# Patient Record
Sex: Female | Born: 1975 | Race: Black or African American | Hispanic: No | Marital: Single | Smoking: Never smoker
Health system: Southern US, Community
[De-identification: ages and names within clinical notes are randomized; demographics above are authoritative.]

## PROBLEM LIST (undated history)

## (undated) DIAGNOSIS — I10 Essential (primary) hypertension: Secondary | ICD-10-CM

---

## 2016-05-06 ENCOUNTER — Encounter (HOSPITAL_BASED_OUTPATIENT_CLINIC_OR_DEPARTMENT_OTHER): Payer: Self-pay | Admitting: *Deleted

## 2016-05-06 ENCOUNTER — Emergency Department (HOSPITAL_BASED_OUTPATIENT_CLINIC_OR_DEPARTMENT_OTHER): Payer: BC Managed Care – PPO

## 2016-05-06 ENCOUNTER — Emergency Department (HOSPITAL_BASED_OUTPATIENT_CLINIC_OR_DEPARTMENT_OTHER)
Admission: EM | Admit: 2016-05-06 | Discharge: 2016-05-06 | Disposition: A | Discharge status: Home / Self Care | Payer: BC Managed Care – PPO | Attending: Emergency Medicine | Admitting: Emergency Medicine

## 2016-05-06 DIAGNOSIS — O209 Hemorrhage in early pregnancy, unspecified: Secondary | ICD-10-CM | POA: Diagnosis not present

## 2016-05-06 DIAGNOSIS — O200 Threatened abortion: Secondary | ICD-10-CM | POA: Diagnosis not present

## 2016-05-06 DIAGNOSIS — N939 Abnormal uterine and vaginal bleeding, unspecified: Secondary | ICD-10-CM

## 2016-05-06 DIAGNOSIS — N898 Other specified noninflammatory disorders of vagina: Secondary | ICD-10-CM | POA: Diagnosis not present

## 2016-05-06 DIAGNOSIS — Z3A08 8 weeks gestation of pregnancy: Secondary | ICD-10-CM | POA: Insufficient documentation

## 2016-05-06 DIAGNOSIS — I10 Essential (primary) hypertension: Secondary | ICD-10-CM | POA: Diagnosis not present

## 2016-05-06 HISTORY — DX: Essential (primary) hypertension: I10

## 2016-05-06 LAB — WET PREP, GENITAL
SPERM: NONE SEEN
Trich, Wet Prep: NONE SEEN
Yeast Wet Prep HPF POC: NONE SEEN

## 2016-05-06 LAB — URINALYSIS, ROUTINE W REFLEX MICROSCOPIC
Bilirubin Urine: NEGATIVE
Glucose, UA: NEGATIVE mg/dL
HGB URINE DIPSTICK: NEGATIVE
Ketones, ur: NEGATIVE mg/dL
Leukocytes, UA: NEGATIVE
NITRITE: NEGATIVE
PH: 7 (ref 5.0–8.0)
Protein, ur: NEGATIVE mg/dL
SPECIFIC GRAVITY, URINE: 1.024 (ref 1.005–1.030)

## 2016-05-06 LAB — CBC WITH DIFFERENTIAL/PLATELET
Basophils Absolute: 0 10*3/uL (ref 0.0–0.1)
Basophils Relative: 0 %
Eosinophils Absolute: 0.1 10*3/uL (ref 0.0–0.7)
Eosinophils Relative: 1 %
HCT: 34.7 % — ABNORMAL LOW (ref 36.0–46.0)
HEMOGLOBIN: 11.9 g/dL — AB (ref 12.0–15.0)
LYMPHS ABS: 1.5 10*3/uL (ref 0.7–4.0)
LYMPHS PCT: 31 %
MCH: 31.2 pg (ref 26.0–34.0)
MCHC: 34.3 g/dL (ref 30.0–36.0)
MCV: 90.8 fL (ref 78.0–100.0)
Monocytes Absolute: 0.6 10*3/uL (ref 0.1–1.0)
Monocytes Relative: 13 %
NEUTROS PCT: 55 %
Neutro Abs: 2.6 10*3/uL (ref 1.7–7.7)
Platelets: 173 10*3/uL (ref 150–400)
RBC: 3.82 MIL/uL — AB (ref 3.87–5.11)
RDW: 11.5 % (ref 11.5–15.5)
WBC: 4.7 10*3/uL (ref 4.0–10.5)

## 2016-05-06 LAB — HCG, QUANTITATIVE, PREGNANCY: hCG, Beta Chain, Quant, S: 281835 m[IU]/mL — ABNORMAL HIGH (ref <5)

## 2016-05-06 LAB — PREGNANCY, URINE: Preg Test, Ur: POSITIVE — AB

## 2016-05-06 MED ORDER — METRONIDAZOLE 500 MG PO TABS: 500.0000 mg | ORAL_TABLET | Freq: Two times a day (BID) | ORAL | 0 refills | Status: AC

## 2016-05-06 NOTE — ED Notes (Signed)
Pt reports lower abdominal cramping that started on Friday. Pt had (+) preg test on 04/17/16. Pts first day of LMP was 03/17/16. Pt was due to have first OB appt in February.

## 2016-05-06 NOTE — ED Notes (Signed)
Pt is [id number]

## 2016-05-06 NOTE — ED Notes (Signed)
Pt transported to US at this time. 

## 2016-05-06 NOTE — ED Triage Notes (Addendum)
Pt c/o lower abd cramping x 3 days and vaginal bleeding with clots x 1 day HOme preg positive ? 6 weeks preg

## 2016-05-06 NOTE — Discharge Instructions (Signed)
Your ultrasound is concerning for an abnormal pregnancy.  Please follow up with your OBGYN for further evaluation.    CLINICAL DATA:  Vaginal bleeding and cramping for 4 days. Gestational age by LMP of 7 weeks 1 day.   EXAM: OBSTETRIC <14 WK US AND TRANSVAGINAL OB US   TECHNIQUE: Both transabdominal and transvaginal ultrasound examinations were performed for complete evaluation of the gestation as well as the maternal uterus, adnexal regions, and pelvic cul-de-sac. Transvaginal technique was performed to assess early pregnancy.   COMPARISON:  None.   FINDINGS: Intrauterine gestational sac: Single   Yolk sac:  Visualized.   Embryo: Visualized, but unusual appearance with suspected diffuse body wall edema/possible early hydrops   Cardiac Activity: Visualized, but cardiac activity appears abnormal location, course the periphery of the trunk   Heart Rate: 145  bpm   CRL:  22  mm   8 w   6 d                  US EDC: 12/10/2016   Subchorionic hemorrhage:  Small subchorionic hemorrhage   Maternal uterus/adnexae: Multiple small uterine fibroids, largest measuring 1.9 cm. Small right ovarian corpus luteum noted. No adnexal mass or abnormal free fluid identified.   IMPRESSION: Single living IUP measuring 8 weeks 6 days, with US EDC of 12/10/2016.   Abnormal appearance of embryo, possibly due to early hydrops. Aneuploidy cannot be excluded. Recommend follow-up with quantitative hCG levels, and follow-up ultrasound in 10-14 days.   Small subchorionic hemorrhage and multiple small uterine fibroids.   No suspicious adnexal mass or abnormal free fluid.

## 2016-05-06 NOTE — ED Provider Notes (Signed)
[facility] Provider Note   CSN: [id number] Arrival date & time: 05/06/16  0902     History   Chief Complaint Chief Complaint  Patient presents with  . Vaginal Bleeding    HPI Elizabeth Robertson is a 41 y.o. female.  The history is provided by the patient. No language interpreter was used.  Vaginal Bleeding  Primary symptoms include vaginal bleeding.   Elizabeth Robertson is a 41 y.o. female who presents to the Emergency Department complaining of vaginal bleeding. [name] is a G5 P4 that presents with vaginal bleeding. First day of her last menstrual period was 03/17/2016. She had a positive home pregnancy test was scheduled to see her OB/GYN in early February. She developed some lower abdominal cramping 2 days ago. Yesterday when she went to urinate she had pink mucous when she wiped. This morning when she woke she had wet undergarments covered in blood and she passed a large blood clots this morning. She has minimal lower abdominal discomfort. No fevers, nausea, vomiting. Her blood type is B+. She has a history of prior spontaneous abortion at 3-4 months Past Medical History:  Diagnosis Date  . Hypertension     There are no active problems to display for this patient.   Past Surgical History:  Procedure Laterality Date  . CESAREAN SECTION      OB History    Gravida Para Term Preterm AB Living   1             SAB TAB Ectopic Multiple Live Births                   Home Medications    Prior to Admission medications   Medication Sig Start Date End Date Taking? Authorizing Provider  labetalol (NORMODYNE) 100 MG tablet Take 100 mg by mouth 2 (two) times daily.   Yes Historical Provider, MD  metroNIDAZOLE (FLAGYL) 500 MG tablet Take 1 tablet (500 mg total) by mouth 2 (two) times daily. 05/06/16   Tilden Fossa, MD    Family History No family history on file.  Social History Social History  Substance Use Topics  . Smoking status: Never Smoker  . Smokeless  tobacco: Not on file  . Alcohol use No     Allergies   Sulfa antibiotics   Review of Systems Review of Systems  Genitourinary: Positive for vaginal bleeding.  All other systems reviewed and are negative.    Physical Exam Updated Vital Signs BP 126/78 (BP Location: Right Arm)   Pulse 78   Temp 98.2 F (36.8 C)   Resp 16   Ht 5\' 6"  (1.676 m)   Wt 165 lb (74.8 kg)   LMP 03/17/2016   SpO2 100%   BMI 26.63 kg/m   Physical Exam  Constitutional: She is oriented to person, place, and time. She appears well-developed and well-nourished.  HENT:  Head: Normocephalic and atraumatic.  Cardiovascular: Normal rate and regular rhythm.   No murmur heard. Pulmonary/Chest: Effort normal and breath sounds normal. No respiratory distress.  Abdominal: Soft. There is no tenderness. There is no rebound and no guarding.  Genitourinary:  Genitourinary Comments: Small to moderate amount of vaginal bleeding that is dark. No clot in the cervical os or vaginal canal. Cervical os is fingertip open. No CMT or adnexal tenderness. No repooling of blood.  Musculoskeletal: She exhibits no edema or tenderness.  Neurological: She is alert and oriented to person, place, and time.  Skin: Skin is warm and dry.  Psychiatric: She has a normal mood and affect. Her behavior is normal.  Nursing note and vitals reviewed.    ED Treatments / Results  Labs (all labs ordered are listed, but only abnormal results are displayed) Labs Reviewed  WET PREP, GENITAL - Abnormal; Notable for the following:       Result Value   Clue Cells Wet Prep HPF POC PRESENT (*)    WBC, Wet Prep HPF POC MANY (*)    All other components within normal limits  PREGNANCY, URINE - Abnormal; Notable for the following:    Preg Test, Ur POSITIVE (*)    All other components within normal limits  CBC WITH DIFFERENTIAL/PLATELET - Abnormal; Notable for the following:    RBC 3.82 (*)    Hemoglobin 11.9 (*)    HCT 34.7 (*)    All other  components within normal limits  HCG, QUANTITATIVE, PREGNANCY - Abnormal; Notable for the following:    hCG, Beta Chain, [place] LongestQuant, S [id number] (*)    All other components within normal limits  URINALYSIS, ROUTINE W REFLEX MICROSCOPIC  RPR  HIV ANTIBODY (ROUTINE TESTING)  GC[facility]AM[facility] PROBE AMP [facility]    EKG  EKG Interpretation None       Radiology [place] Ob Comp < 14 Wks  Result Date: 05/06/2016 CLINICAL DATA:  Vaginal bleeding and cramping for 4 days. Gestational age by LMP of 7 weeks 1 day. EXAM: OBSTETRIC <14 WK US AND TRANSVAGINAL OB US TECHNIQUE: Both transabdominal and transvaginal ultrasound examinations were performed for complete evaluation of the gestation as well as the maternal uterus, adnexal regions, and pelvic cul-de-sac. Transvaginal technique was performed to assess early pregnancy. COMPARISON:  None. FINDINGS: Intrauterine gestational sac: Single Yolk sac:  Visualized. Embryo: Visualized, but unusual appearance with suspected diffuse body wall edema/possible early hydrops Cardiac Activity: Visualized, but cardiac activity appears abnormal location, course the periphery of the trunk Heart Rate: 145  bpm CRL:  22  mm   8 w   6 d                  US EDC: 12/10/2016 Subchorionic hemorrhage:  Small subchorionic hemorrhage Maternal uterus/adnexae: Multiple small uterine fibroids, largest measuring 1.9 cm. Small right ovarian corpus luteum noted. No adnexal mass or abnormal free fluid identified. IMPRESSION: Single living IUP measuring 8 weeks 6 days, with US EDC of 12/10/2016. Abnormal appearance of embryo, possibly due to early hydrops. Aneuploidy cannot be excluded. Recommend follow-up with quantitative hCG levels, and follow-up ultrasound in 10-14 days. Small subchorionic hemorrhage and multiple small uterine fibroids. No suspicious adnexal mass or abnormal free fluid. Electronically Signed   By: Myles RosenthalJohn  Stahl M.D.   On: 05/06/2016 11:17   [place] Ob Transvaginal  Result  Date: 05/06/2016 CLINICAL DATA:  Vaginal bleeding and cramping for 4 days. Gestational age by LMP of 7 weeks 1 day. EXAM: OBSTETRIC <14 WK US AND TRANSVAGINAL OB US TECHNIQUE: Both transabdominal and transvaginal ultrasound examinations were performed for complete evaluation of the gestation as well as the maternal uterus, adnexal regions, and pelvic cul-de-sac. Transvaginal technique was performed to assess early pregnancy. COMPARISON:  None. FINDINGS: Intrauterine gestational sac: Single Yolk sac:  Visualized. Embryo: Visualized, but unusual appearance with suspected diffuse body wall edema/possible early hydrops Cardiac Activity: Visualized, but cardiac activity appears abnormal location, course the periphery of the trunk Heart Rate: 145  bpm CRL:  22  mm   8 w   6 d  [place] EDC: 12/10/2016 Subchorionic hemorrhage:  Small subchorionic hemorrhage Maternal uterus/adnexae: Multiple small uterine fibroids, largest measuring 1.9 cm. Small right ovarian corpus luteum noted. No adnexal mass or abnormal free fluid identified. IMPRESSION: Single living IUP measuring 8 weeks 6 days, with [place] EDC of 12/10/2016. Abnormal appearance of embryo, possibly due to early hydrops. Aneuploidy cannot be excluded. Recommend follow-up with quantitative hCG levels, and follow-up ultrasound in 10-14 days. Small subchorionic hemorrhage and multiple small uterine fibroids. No suspicious adnexal mass or abnormal free fluid. Electronically Signed   By: Myles Rosenthal M.D.   On: 05/06/2016 11:17    Procedures Procedures (including critical care time)  Medications Ordered in ED Medications - No data to display   Initial Impression / Assessment and Plan / ED Course  I have reviewed the triage vital signs and the nursing notes.  Pertinent labs & imaging results that were available during my care of the patient were reviewed by me and considered in my medical decision making (see chart for details).     Patient is a G5 P4  here for evaluation of vaginal bleeding. She has mild bleeding on examination with cervical os is fingertip open. Ultrasound demonstrates subchorionic hemorrhage as well as an abnormal appearance to the embryo. Discussed with patient findings of studies and concern for threatened miscarriage and potential abnormal pregnancy. Counseled patient on importance of close OB/GYN follow-up as well as return precautions for worsening bleeding.  Final Clinical Impressions(s) / ED Diagnoses   Final diagnoses:  Vaginal bleeding  Threatened miscarriage    New Prescriptions Discharge Medication List as of 05/06/2016 11:50 AM    START taking these medications   Details  metroNIDAZOLE (FLAGYL) 500 MG tablet Take 1 tablet (500 mg total) by mouth 2 (two) times daily., Starting Sun 05/06/2016, Print         Tilden Fossa, MD 05/07/16 1217

## 2016-05-06 NOTE — ED Notes (Signed)
ED Provider at bedside. 

## 2016-05-07 LAB — GC/CHLAMYDIA PROBE AMP (~~LOC~~) NOT AT ARMC
CHLAMYDIA, DNA PROBE: NEGATIVE
NEISSERIA GONORRHEA: NEGATIVE

## 2016-05-07 LAB — RPR: RPR: NONREACTIVE

## 2016-05-08 LAB — HIV ANTIBODY (ROUTINE TESTING W REFLEX): HIV SCREEN 4TH GENERATION: NONREACTIVE

## 2018-12-24 IMAGING — US US OB COMP LESS 14 WK
1 series · 13 of 28 positions shown · non-contrast
Comparison: None.

CLINICAL DATA: Vaginal bleeding and cramping for 4 days.
Gestational age by LMP of 7 weeks 1 day.

EXAM:
OBSTETRIC <14 WK US AND TRANSVAGINAL OB US
TECHNIQUE: Both transabdominal and transvaginal ultrasound examinations were
performed for complete evaluation of the gestation as well as the
maternal uterus, adnexal regions, and pelvic cul-de-sac.
Transvaginal technique was performed to assess early pregnancy.

[Series 1: us ob comp less 14 wk · 0.22mm/px · 13 of 110 slices shown]
[im 5/110]
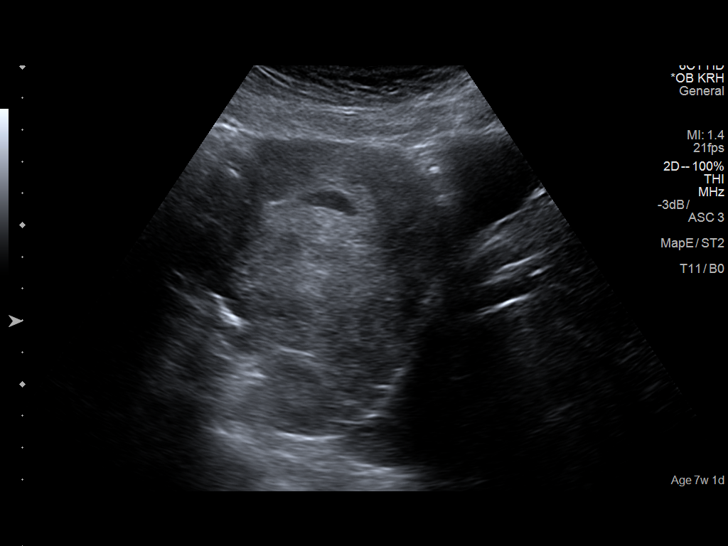
[im 13/110]
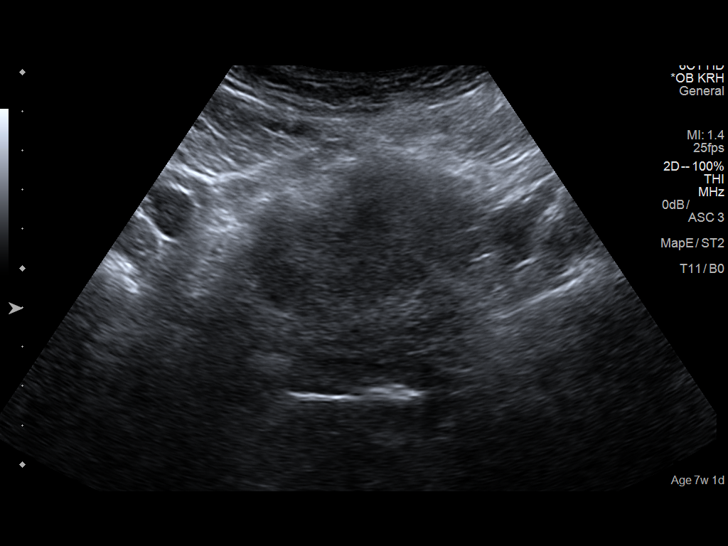
[im 21/110]
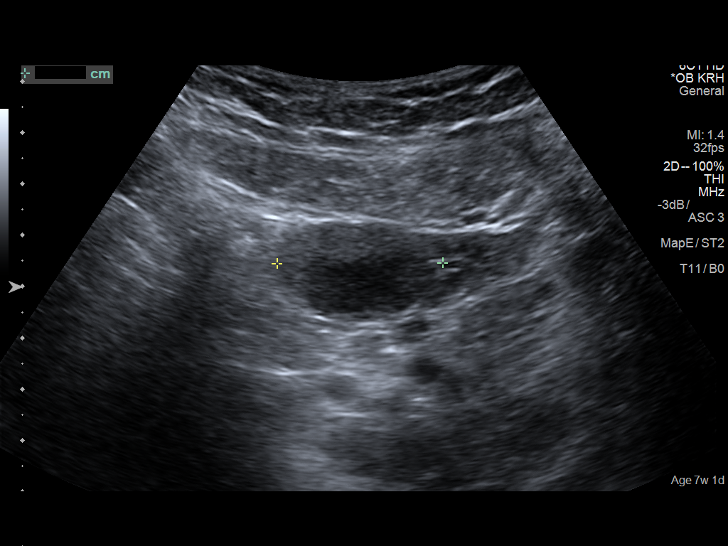
[im 29/110]
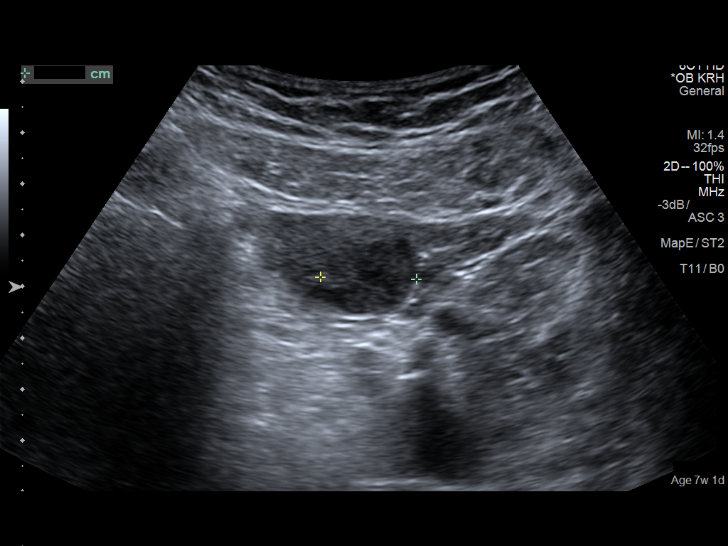
[im 37/110]
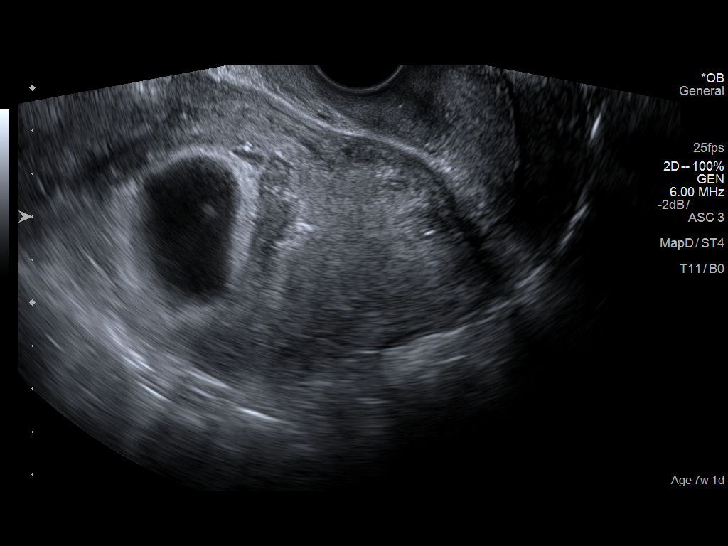
[im 45/110]
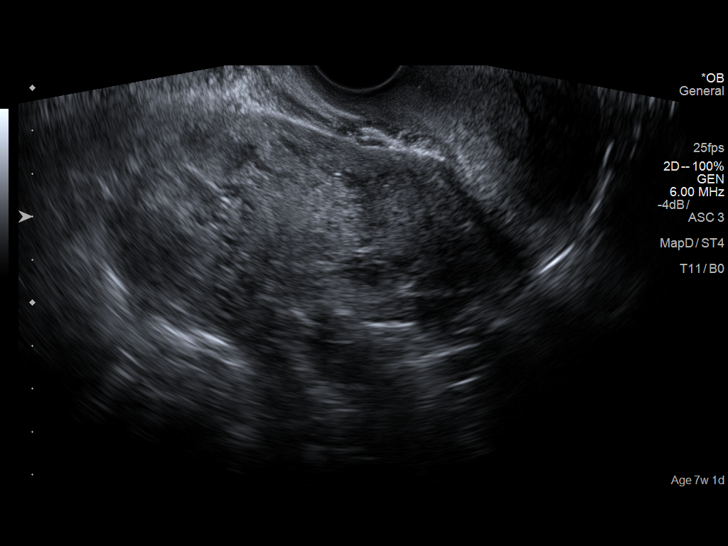
[im 57/110]
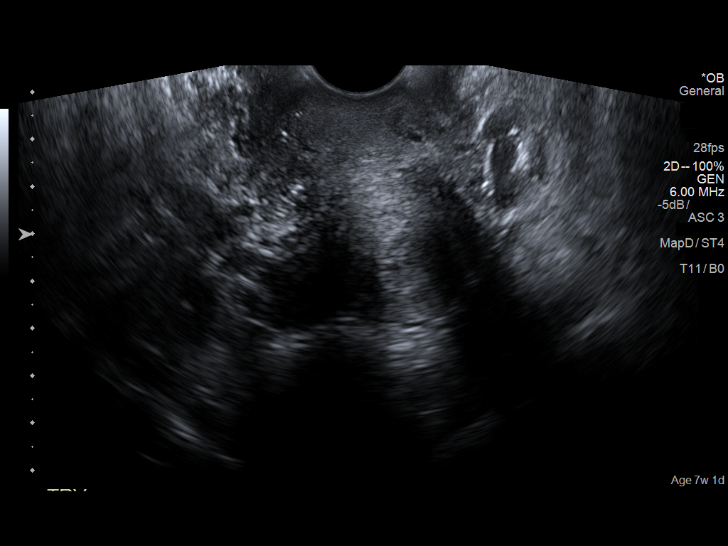
[im 65/110]
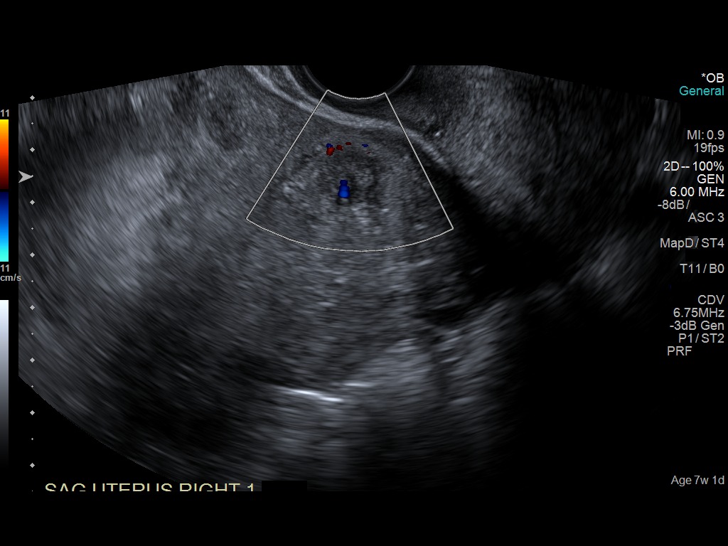
[im 73/110]
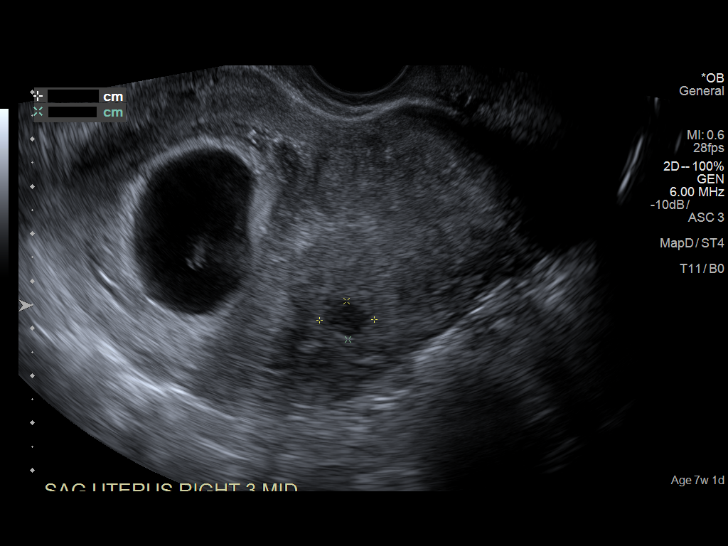
[im 81/110]
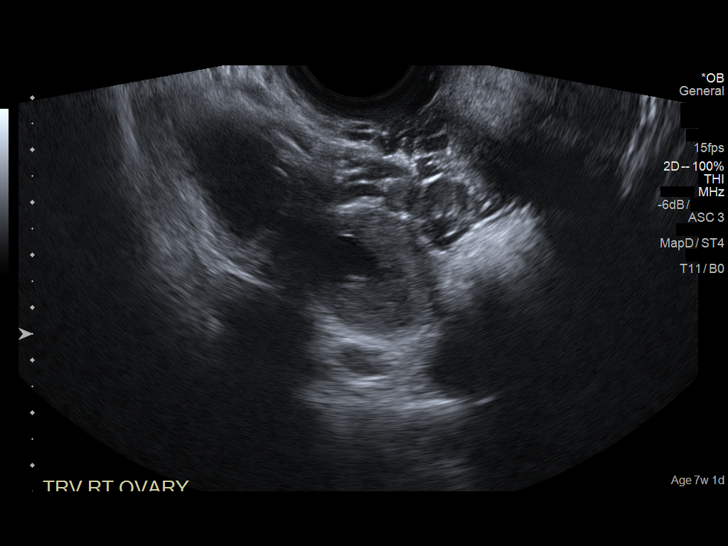
[im 89/110]
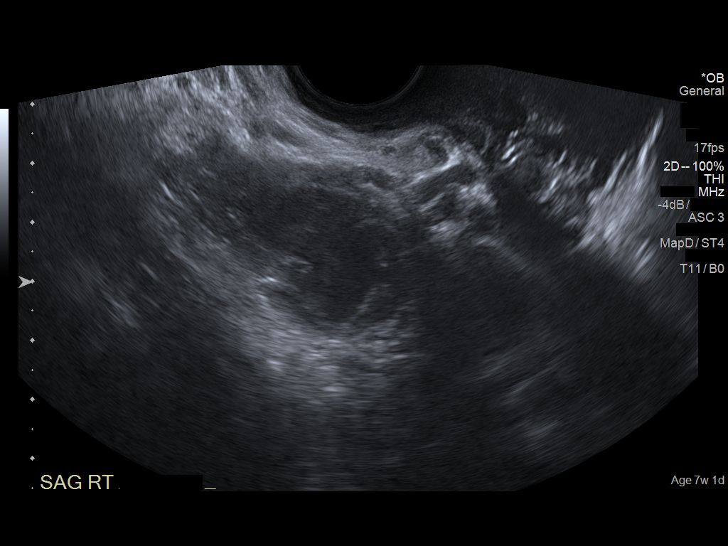
[im 97/110]
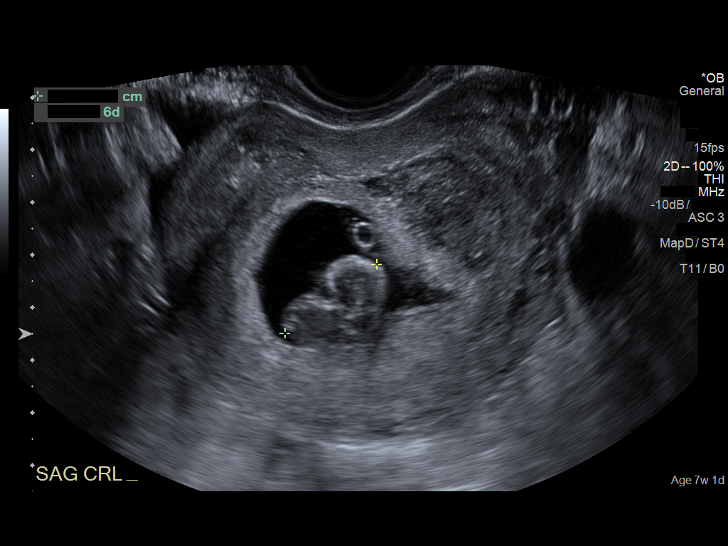
[im 105/110]
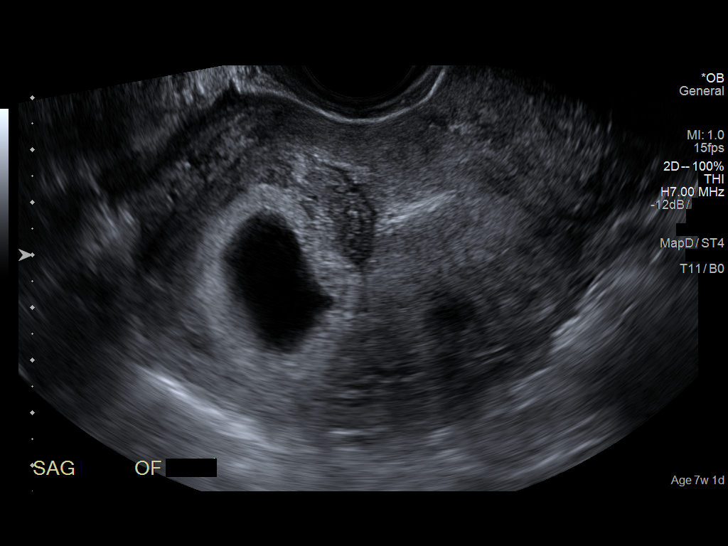

[13 of 28 positions shown; findings below may reference images not displayed]

FINDINGS: Intrauterine gestational sac: Single

Yolk sac:  Visualized.

Embryo: Visualized, but unusual appearance with suspected diffuse
body wall edema/possible early hydrops

Cardiac Activity: Visualized, but cardiac activity appears abnormal
location, course the periphery of the trunk

Heart Rate: 145  bpm

CRL:  22  mm   8 w   6 d                  US EDC: 12/10/2016

Subchorionic hemorrhage:  Small subchorionic hemorrhage

Maternal uterus/adnexae: Multiple small uterine fibroids, largest
measuring 1.9 cm. Small right ovarian corpus luteum noted. No
adnexal mass or abnormal free fluid identified.
IMPRESSION: Single living IUP measuring 8 weeks 6 days, with US EDC of
12/10/2016.

Abnormal appearance of embryo, possibly due to early hydrops.
Aneuploidy cannot be excluded. Recommend follow-up with quantitative
hCG levels, and follow-up ultrasound in 10-14 days.

Small subchorionic hemorrhage and multiple small uterine fibroids.

No suspicious adnexal mass or abnormal free fluid.
